# Patient Record
Sex: Female | Born: 1960 | Race: Black or African American | Hispanic: No | Marital: Single | Smoking: Former smoker
Health system: Southern US, Community
[De-identification: ages and names within clinical notes are randomized; demographics above are authoritative.]

## PROBLEM LIST (undated history)

## (undated) DIAGNOSIS — G43909 Migraine, unspecified, not intractable, without status migrainosus: Secondary | ICD-10-CM

## (undated) HISTORY — PX: ABDOMINAL HYSTERECTOMY: SHX81

## (undated) HISTORY — PX: UTERINE FIBROID SURGERY: SHX826

---

## 2014-07-08 ENCOUNTER — Emergency Department (HOSPITAL_COMMUNITY)
Admission: EM | Admit: 2014-07-08 | Discharge: 2014-07-08 | Disposition: A | Discharge status: Home / Self Care | Payer: Self-pay | Attending: Emergency Medicine | Admitting: Emergency Medicine

## 2014-07-08 ENCOUNTER — Encounter (HOSPITAL_COMMUNITY): Payer: Self-pay | Admitting: Physical Medicine and Rehabilitation

## 2014-07-08 DIAGNOSIS — Z8679 Personal history of other diseases of the circulatory system: Secondary | ICD-10-CM | POA: Insufficient documentation

## 2014-07-08 DIAGNOSIS — J040 Acute laryngitis: Secondary | ICD-10-CM | POA: Insufficient documentation

## 2014-07-08 DIAGNOSIS — H65111 Acute and subacute allergic otitis media (mucoid) (sanguinous) (serous), right ear: Secondary | ICD-10-CM | POA: Insufficient documentation

## 2014-07-08 DIAGNOSIS — Z87891 Personal history of nicotine dependence: Secondary | ICD-10-CM | POA: Insufficient documentation

## 2014-07-08 HISTORY — DX: Migraine, unspecified, not intractable, without status migrainosus: G43.909

## 2014-07-08 MED ORDER — SALINE SPRAY 0.65 % NA SOLN: 1.0000 | NASAL | Status: DC | PRN

## 2014-07-08 MED ORDER — CEFDINIR 300 MG PO CAPS
300.0000 mg | ORAL_CAPSULE | Freq: Two times a day (BID) | ORAL | Status: DC
Start: 2014-07-08 — End: 2014-07-08

## 2014-07-08 MED ORDER — IBUPROFEN 600 MG PO TABS: 600.0000 mg | ORAL_TABLET | Freq: Four times a day (QID) | ORAL | Status: DC | PRN

## 2014-07-08 MED ORDER — AZITHROMYCIN 250 MG PO TABS: 250.0000 mg | ORAL_TABLET | Freq: Every day | ORAL | Status: DC

## 2014-07-08 NOTE — ED Notes (Signed)
Pt presents to department for evaluation of sore throat, runny nose and chest congestion. Respirations unlabored. NAD.

## 2014-07-08 NOTE — Discharge Instructions (Signed)
Take 400-600mg Ibuprofen (Motrin) every 6-8 hours for fever and pain  °Alternate with Tylenol  °Take 500-1000mg Tylenol every 4-6 hours as needed for fever and pain  °Follow-up with your primary care provider next week for recheck of symptoms if not improving.  °Be sure to drink plenty of fluids and rest, at least 8hrs of sleep a night, preferably more while you are sick. °Return to the ED if you cannot keep down fluids/signs of dehydration, fever not reducing with Tylenol, difficulty breathing/wheezing, stiff neck, worsening condition, or other concerns (see below)  ° °

## 2014-07-08 NOTE — ED Provider Notes (Signed)
CSN: [id number]     Arrival date & time 07/08/14  1046 History  This chart was scribed for non-physician practitioner, Junius Finner, PA-C, working with Zadie Rhine, MD by Charline Bills, ED Scribe. This patient was seen in room TR06C/TR06C and the patient's care was started at 11:24 AM.   Chief Complaint  Patient presents with  . Sore Throat  . Nasal Congestion   The history is provided by the patient. No language interpreter was used.   HPI Comments: Emma Estrada is a 54 y.o. female who presents to the Emergency Department complaining of gradually worsening sore throat for the past 2 days. She reports associated rhinorrhea, congestion, L ear pain, hoarseness, cough, chest pain only with deep breaths. Throat pain is 6/10 at worst. Pt denies fever, HA, abdominal pain, nausea, vomiting, h/o asthma, h/o COPD. She has tried NyQuil last night. Possible allergy to Augmenting but not other known allergies. No local PCP; pt recently moved from [facility].    Past Medical History  Diagnosis Date  . Migraine    History reviewed. No pertinent past surgical history. No family history on file. History  Substance Use Topics  . Smoking status: Former Games developer  . Smokeless tobacco: Not on file  . Alcohol Use: No   OB History    No data available     Review of Systems  Constitutional: Negative for fever.  HENT: Positive for congestion, ear pain, rhinorrhea, sore throat and voice change.   Respiratory: Positive for cough.   Cardiovascular: Positive for chest pain (only with deep breaths).  Gastrointestinal: Negative for nausea, vomiting and abdominal pain.  Neurological: Negative for headaches.   Allergies  Review of patient's allergies indicates no known allergies.  Home Medications   Prior to Admission medications   Medication Sig Start Date End Date Taking? Authorizing Provider  cefdinir (OMNICEF) 300 MG capsule Take 1 capsule (300 mg total) by mouth 2 (two) times daily. 07/08/14   Junius Finner, PA-C  ibuprofen (ADVIL,MOTRIN) 600 MG tablet Take 1 tablet (600 mg total) by mouth every 6 (six) hours as needed. 07/08/14   Junius Finner, PA-C  sodium chloride (OCEAN) 0.65 % SOLN nasal spray Place 1 spray into both nostrils as needed for congestion. 07/08/14   Junius Finner, PA-C   BP 154/84 mmHg  Pulse 66  Temp(Src) 98.2 F (36.8 C) (Oral)  Resp 20  SpO2 98% Physical Exam  Constitutional: She is oriented to person, place, and time. She appears well-developed and well-nourished.  HENT:  Head: Normocephalic and atraumatic.  Right Ear: No mastoid tenderness. Tympanic membrane is erythematous and bulging.  Left Ear: No mastoid tenderness. Tympanic membrane is not erythematous. A middle ear effusion is present.  Nose: Mucosal edema and rhinorrhea present. Right sinus exhibits no maxillary sinus tenderness and no frontal sinus tenderness. Left sinus exhibits frontal sinus tenderness. Left sinus exhibits no maxillary sinus tenderness.  Mouth/Throat: Uvula is midline and mucous membranes are normal. Posterior oropharyngeal edema (mild) and posterior oropharyngeal erythema present. No oropharyngeal exudate or tonsillar abscesses.  Hoarse voice. No "hot potato voice".   Eyes: EOM are normal.  Neck: Normal range of motion. Neck supple.  Cardiovascular: Normal rate, regular rhythm and normal heart sounds.   Pulmonary/Chest: Effort normal and breath sounds normal. No stridor.  Abdominal: Soft. There is no tenderness.  Musculoskeletal: Normal range of motion.  Neurological: She is alert and oriented to person, place, and time.  Skin: Skin is warm and dry.  Psychiatric: She has a  normal mood and affect. Her behavior is normal.  Nursing note and vitals reviewed.  ED Course  Procedures (including critical care time) DIAGNOSTIC STUDIES: Oxygen Saturation is 98% on RA, normal by my interpretation.    COORDINATION OF CARE: 11:30 AM-Discussed treatment plan which includes EKG, Omnicef, nasal  spay, 600 mg ibuprofen with pt at bedside and pt agreed to plan.   Labs Review Labs Reviewed - No data to display  Imaging Review No results found.   EKG Interpretation None      MDM   Final diagnoses:  Acute mucoid otitis media of right ear  Laryngitis    Pt presenting to ED with c/o URI symptoms that started 2 days ago. Right TM is erythematous and bulging. Voice is hoarse on exam w/o stridor or hot-potato voice. No evidence of tonsillar abscess. No respiratory distress. Lungs: CTAB.  No CXR indicated at this time.  Will tx for AOM. Pt believes she had an allergic reaction to augmentin in the past, will tx with cefdinir.  Home care instructions provided. Advised to f/u with PCP at [facility] also provided resource guide to f/u if symptoms not improving. Return precautions provided. Pt verbalized understanding and agreement with tx plan.   I personally performed the services described in this documentation, which was scribed in my presence. The recorded information has been reviewed and is accurate.   Junius Finnerrin O'Malley, PA-C 07/08/14 1216  Zadie Rhineonald Wickline, MD 07/08/14 [phone]

## 2016-07-30 ENCOUNTER — Emergency Department (HOSPITAL_COMMUNITY): Payer: Self-pay

## 2016-07-30 ENCOUNTER — Encounter (HOSPITAL_COMMUNITY): Payer: Self-pay | Admitting: Emergency Medicine

## 2016-07-30 DIAGNOSIS — R0789 Other chest pain: Secondary | ICD-10-CM | POA: Insufficient documentation

## 2016-07-30 DIAGNOSIS — Z79899 Other long term (current) drug therapy: Secondary | ICD-10-CM | POA: Insufficient documentation

## 2016-07-30 DIAGNOSIS — Z87891 Personal history of nicotine dependence: Secondary | ICD-10-CM | POA: Insufficient documentation

## 2016-07-30 LAB — CBC
HEMATOCRIT: 39.2 % (ref 36.0–46.0)
Hemoglobin: 13.4 g/dL (ref 12.0–15.0)
MCH: 27.7 pg (ref 26.0–34.0)
MCHC: 34.2 g/dL (ref 30.0–36.0)
MCV: 81 fL (ref 78.0–100.0)
Platelets: 274 10*3/uL (ref 150–400)
RBC: 4.84 MIL/uL (ref 3.87–5.11)
RDW: 12.9 % (ref 11.5–15.5)
WBC: 10.2 10*3/uL (ref 4.0–10.5)

## 2016-07-30 LAB — BASIC METABOLIC PANEL
Anion gap: 10 (ref 5–15)
BUN: 12 mg/dL (ref 6–20)
CHLORIDE: 106 mmol/L (ref 101–111)
CO2: 23 mmol/L (ref 22–32)
Calcium: 9.3 mg/dL (ref 8.9–10.3)
Creatinine, Ser: 0.86 mg/dL (ref 0.44–1.00)
GFR calc non Af Amer: >60 mL/min (ref >60)
Glucose, Bld: 123 mg/dL — ABNORMAL HIGH (ref 65–99)
POTASSIUM: 3.7 mmol/L (ref 3.5–5.1)
Sodium: 139 mmol/L (ref 135–145)

## 2016-07-30 LAB — I-STAT TROPONIN, ED: TROPONIN I, POC: 0 ng/mL (ref 0.00–0.08)

## 2016-07-30 NOTE — ED Triage Notes (Signed)
Patient with chest pain that started earlier today around 3pm.   Patient did have some mild shortness of breath and mild nausea at the start of the pain.  Pain is located in right chest and radiating to right wrist.

## 2016-07-31 ENCOUNTER — Emergency Department (HOSPITAL_COMMUNITY)
Admission: EM | Admit: 2016-07-31 | Discharge: 2016-07-31 | Disposition: A | Discharge status: Home / Self Care | Payer: Self-pay | Attending: Emergency Medicine | Admitting: Emergency Medicine

## 2016-07-31 DIAGNOSIS — R079 Chest pain, unspecified: Secondary | ICD-10-CM

## 2016-07-31 DIAGNOSIS — R0789 Other chest pain: Secondary | ICD-10-CM

## 2016-07-31 LAB — I-STAT TROPONIN, ED: TROPONIN I, POC: 0.01 ng/mL (ref 0.00–0.08)

## 2016-07-31 MED ORDER — KETOROLAC TROMETHAMINE 30 MG/ML IJ SOLN
30.0000 mg | Freq: Once | INTRAMUSCULAR | Status: AC
  Administered 2016-07-31: 30 mg via INTRAVENOUS
  Filled 2016-07-31: qty 1

## 2016-07-31 NOTE — ED Notes (Signed)
Pt verbalized understanding of d/c instructions and has no further questions. Pt is stable, A&Ox4, VSS.  

## 2016-07-31 NOTE — Discharge Instructions (Signed)
Please read and follow all provided instructions.  Your diagnoses today include:  1. Chest pain, unspecified type   2. Chest wall pain     Tests performed today include: An EKG of your heart A chest x-ray Cardiac enzymes - a blood test for heart muscle damage Blood counts and electrolytes Vital signs. See below for your results today.   Medications prescribed:   Take any prescribed medications only as directed.  Follow-up instructions: Please follow-up with your primary care provider as soon as you can for further evaluation of your symptoms.   Return instructions:  SEEK IMMEDIATE MEDICAL ATTENTION IF: You have severe chest pain, especially if the pain is crushing or pressure-like and spreads to the arms, back, neck, or jaw, or if you have sweating, nausea (feeling sick to your stomach), or shortness of breath. THIS IS AN EMERGENCY. Don't wait to see if the pain will go away. Get medical help at once. Call 911 or 0 (operator). DO NOT drive yourself to the hospital.  Your chest pain gets worse and does not go away with rest.  You have an attack of chest pain lasting longer than usual, despite rest and treatment with the medications your caregiver has prescribed.  You wake from sleep with chest pain or shortness of breath. You feel dizzy or faint. You have chest pain not typical of your usual pain for which you originally saw your caregiver.  You have any other emergent concerns regarding your health.  Additional Information: Chest pain comes from many different causes. Your caregiver has diagnosed you as having chest pain that is not specific for one problem, but does not require admission.  You are at low risk for an acute heart condition or other serious illness.   Your vital signs today were: BP (!) 155/86    Pulse 77    Temp 98.4 F (36.9 C) (Oral)    Resp 19    SpO2 99%  If your blood pressure (BP) was elevated above 135/85 this visit, please have this repeated by your doctor  within one month. --------------

## 2016-07-31 NOTE — ED Provider Notes (Signed)
[facility]-EMERGENCY DEPT Provider Note   CSN: [id number] Arrival date & time: 07/30/16  2146     History   Chief Complaint Chief Complaint  Patient presents with  . Chest Pain    HPI Emma Estrada is a 56 y.o. female.  HPI  56 y.o. female, presents to the Emergency Department today complaining of chest pain that started around 1500. This occurred while at her desk while she was typing on the computer. Notes mild shortness of breath as well as nausea with pain. None now. Pain was located on right chest wall. Noted hx same with costochondritis. No fevers. No cough/congestion. No URI symptoms. States that the pain has been constant and unchanged since this morning. Worse with movement. No meds PTA or non tried for pain. No hx ACS. No FH. Noted Risk Factor is HTN. No hx DVT/PE. No recent travel. No recent surgeries. Pt also notes right wrist pain x 1 week. Worse with movement. No trauma to area. Worse with typing. No numbness. No other symptoms noted.     Past Medical History:  Diagnosis Date  . Migraine     There are no active problems to display for this patient.   History reviewed. No pertinent surgical history.  OB History    No data available       Home Medications    Prior to Admission medications   Medication Sig Start Date End Date Taking? Authorizing Provider  azithromycin (ZITHROMAX) 250 MG tablet Take 1 tablet (250 mg total) by mouth daily. Take first 2 tablets together, then 1 every day until finished. 07/08/14   Junius Finner, PA-C  ibuprofen (ADVIL,MOTRIN) 600 MG tablet Take 1 tablet (600 mg total) by mouth every 6 (six) hours as needed. 07/08/14   Junius Finner, PA-C  sodium chloride (OCEAN) 0.65 % SOLN nasal spray Place 1 spray into both nostrils as needed for congestion. 07/08/14   Junius Finner, PA-C    Family History No family history on file.  Social History Social History  Substance Use Topics  . Smoking status: Former Games developer  . Smokeless tobacco: Never  Used  . Alcohol use No     Allergies   Patient has no known allergies.   Review of Systems Review of Systems ROS reviewed and all are negative for acute change except as noted in the HPI.  Physical Exam Updated Vital Signs BP 134/78 (BP Location: Right Arm)   Pulse 63   Temp 98.4 F (36.9 C) (Oral)   Resp 16   SpO2 98%   Physical Exam  Constitutional: She is oriented to person, place, and time. She appears well-developed and well-nourished. No distress.  HENT:  Head: Normocephalic and atraumatic.  Right Ear: Tympanic membrane, external ear and ear canal normal.  Left Ear: Tympanic membrane, external ear and ear canal normal.  Nose: Nose normal.  Mouth/Throat: Uvula is midline, oropharynx is clear and moist and mucous membranes are normal. No trismus in the jaw. No oropharyngeal exudate, posterior oropharyngeal erythema or tonsillar abscesses.  Eyes: EOM are normal. Pupils are equal, round, and reactive to light.  Neck: Normal range of motion. Neck supple. No tracheal deviation present.  Cardiovascular: Normal rate, regular rhythm, S1 normal, S2 normal, normal heart sounds, intact distal pulses and normal pulses.   Pulmonary/Chest: Effort normal and breath sounds normal. No respiratory distress. She has no decreased breath sounds. She has no wheezes. She has no rhonchi. She has no rales.  TTP right anterior chest wall. No palpable or  visible deformities.   Abdominal: Normal appearance and bowel sounds are normal. There is no tenderness.  Musculoskeletal: Normal range of motion.  Right Wrist TTP along lateral aspect. Decrease ROM due to pain. No swelling. No erythema. NVI. Distal pulses appreciated.   Neurological: She is alert and oriented to person, place, and time.  Skin: Skin is warm and dry.  Psychiatric: She has a normal mood and affect. Her speech is normal and behavior is normal. Thought content normal.     ED Treatments / Results  Labs (all labs ordered are  listed, but only abnormal results are displayed) Labs Reviewed  BASIC METABOLIC PANEL - Abnormal; Notable for the following:       Result Value   Glucose, Bld 123 (*)    All other components within normal limits  CBC  I-STAT TROPOININ, ED  I-STAT TROPOININ, ED    EKG  EKG Interpretation  Date/Time:  Monday July 30 2016 21:52:52 EDT Ventricular Rate:  69 PR Interval:  126 QRS Duration: 94 QT Interval:  384 QTC Calculation: 411 R Axis:   71 Text Interpretation:  Normal sinus rhythm Normal ECG No significant change was found Confirmed by Manus Gunning  MD, STEPHEN [phone]) on 07/31/2016 3:19:42 AM       Radiology Dg Chest 2 View  Result Date: 07/30/2016 CLINICAL DATA:  56 y/o  F; right-sided chest pain for 1 day. EXAM: CHEST  2 VIEW COMPARISON:  None. FINDINGS: The heart size and mediastinal contours are within normal limits. Both lungs are clear. Incidental note of azygos fissure. The visualized skeletal structures are unremarkable. IMPRESSION: No active cardiopulmonary disease. Electronically Signed   By: Mitzi Hansen M.D.   On: 07/30/2016 22:58   Dg Wrist Complete Right  Result Date: 07/30/2016 CLINICAL DATA:  56 y/o  F; 1 week of right wrist pain. EXAM: RIGHT WRIST - COMPLETE 3+ VIEW COMPARISON:  None. FINDINGS: No acute fracture or dislocation identified. Chronic ulnar styloid fracture deformity. No focal arthropathy. No soft tissue abnormality is evident. IMPRESSION: No acute bony articular abnormality identified. Chronic ulnar styloid fracture. Electronically Signed   By: Mitzi Hansen M.D.   On: 07/30/2016 23:00    Procedures Procedures (including critical care time)  Medications Ordered in ED Medications - No data to display   Initial Impression / Assessment and Plan / ED Course  I have reviewed the triage vital signs and the nursing notes.  Pertinent labs & imaging results that were available during my care of the patient were reviewed by me and  considered in my medical decision making (see chart for details).  Final Clinical Impressions(s) / ED Diagnoses  {I have reviewed and evaluated the relevant laboratory values. {I have reviewed and evaluated the relevant imaging studies. {I have interpreted the relevant EKG. {I have reviewed the relevant previous healthcare records.  {I obtained HPI from historian. {Patient discussed with supervising physician.  ED Course:  Assessment: Pt is a 56 y.o. female presents with CP since this morning. Constant and unchanged. Nausea and SOB initially, but none now. Chest pain reproducible on exam. Risk Factors for ACS HTN. Given toradol in ED with minimal relief.. Patient is to be discharged with recommendation to follow up with PCP in regards to today's hospital visit. Chest pain is not likely of cardiac or pulmonary etiology d/t presentation, VSS, no tracheal deviation, no JVD or new murmur, RRR, breath sounds equal bilaterally, EKG without acute abnormalities, negative troponin x2, and negative CXR. Heart Score 3. Likely musculoskeletal  in etiology. Pt extremely TTP on anterior aspect of chest. Pt has been advised start a NSAIDsand return to the ED is CP becomes exertional, associated with diaphoresis or nausea, radiates to left jaw/arm, worsens or becomes concerning in any way. Pt appears reliable for follow up and is agreeable to discharge. Patient is in no acute distress. Vital Signs are stable. Patient is able to ambulate. Patient able to tolerate PO.   Disposition/Plan:  DC Home Additional Verbal discharge instructions given and discussed with patient.  Pt Instructed to f/u with PCP in the next week for evaluation and treatment of symptoms. Return precautions given Pt acknowledges and agrees with plan  Supervising Physician Glynn Octave, MD  Final diagnoses:  Chest pain, unspecified type  Chest wall pain    New Prescriptions New Prescriptions   No medications on file     Audry Pili,  PA-C 07/31/16 0503    Glynn Octave, MD 07/31/16 [phone]

## 2017-03-21 ENCOUNTER — Emergency Department (HOSPITAL_COMMUNITY)
Admission: EM | Admit: 2017-03-21 | Discharge: 2017-03-21 | Disposition: A | Discharge status: Home / Self Care | Payer: Self-pay | Attending: Emergency Medicine | Admitting: Emergency Medicine

## 2017-03-21 ENCOUNTER — Encounter (HOSPITAL_COMMUNITY): Payer: Self-pay

## 2017-03-21 ENCOUNTER — Other Ambulatory Visit: Payer: Self-pay

## 2017-03-21 DIAGNOSIS — K1120 Sialoadenitis, unspecified: Secondary | ICD-10-CM | POA: Insufficient documentation

## 2017-03-21 DIAGNOSIS — Z87891 Personal history of nicotine dependence: Secondary | ICD-10-CM | POA: Insufficient documentation

## 2017-03-21 MED ORDER — CLINDAMYCIN HCL 150 MG PO CAPS: 450.0000 mg | ORAL_CAPSULE | Freq: Three times a day (TID) | ORAL | 0 refills | Status: DC

## 2017-03-21 NOTE — Discharge Instructions (Signed)
1. Medications: Clindamycin, usual home medications 2. Treatment: rest, drink plenty of fluids, eat sour foods and candy 3. Follow Up: Please followup with ENT if no improvement in 3-4 days for discussion of your diagnoses and further evaluation after today's visit; Please return to the ER for fevers, increased swelling, difficulty swallowing or other concerns

## 2017-03-21 NOTE — ED Triage Notes (Signed)
Patient presents with a "knot under my tongue." Patient reports it first started when she was eating lunch 2 weeks ago. Since then, the patient reports having the "knot" "pouch out" every time she eats. Patient reports increased facial swelling and a "nasty taste in my mouth." Patient unsure if she has had fevers. Patient reports being given a prescription of PCN for the issue, but states she has taken 4 days of the prescription and "nothing is getting better."

## 2017-03-21 NOTE — ED Provider Notes (Signed)
[facility]-EMERGENCY DEPT Provider Note   CSN: [id number] Arrival date & time: 03/21/17  2014     History   Chief Complaint Chief Complaint  Patient presents with  . Lump under tongue    HPI Emma Estrada is a 56 y.o. female with a hx of migraine headache presents to the Emergency Department complaining of gradual, persistent, progressively worsening swelling under her tongue and right jaw onset 2 weeks ago.  Pt reports pain and swelling are worse after eating but improve after a few hours. She reports recently developing a "sour taste" in her mouth.  She reports no fevers at home.  She completed a course of PCN last week given by her dentist who thought this might be a dental infection, but "x-ray was negative."  She denies pain in her teeth, drooling, difficulty swallowing, difficulty breathing.  Pt denies known hx of HPV.  She is a former smoker.  No personal hx of cancer.  Pt reports the site is painful when she eats, but she has been able to do so.      The history is provided by the patient and medical records. No language interpreter was used.    Past Medical History:  Diagnosis Date  . Migraine     There are no active problems to display for this patient.   History reviewed. No pertinent surgical history.  OB History    No data available       Home Medications    Prior to Admission medications   Medication Sig Start Date End Date Taking? Authorizing Provider  clindamycin (CLEOCIN) 150 MG capsule Take 3 capsules (450 mg total) by mouth 3 (three) times daily. 03/21/17   Tracyann Duffell, Dahlia ClientHannah, PA-C    Family History History reviewed. No pertinent family history.  Social History Social History   Tobacco Use  . Smoking status: Former Games developermoker  . Smokeless tobacco: Never Used  Substance Use Topics  . Alcohol use: No  . Drug use: No     Allergies   Codeine and Ibuprofen   Review of Systems Review of Systems  Constitutional:  Negative for appetite change, diaphoresis, fatigue, fever and unexpected weight change.  HENT: Positive for facial swelling. Negative for mouth sores.        Lump under tongue  Eyes: Negative for visual disturbance.  Respiratory: Negative for cough, chest tightness, shortness of breath and wheezing.   Cardiovascular: Negative for chest pain.  Gastrointestinal: Negative for abdominal pain, constipation, diarrhea, nausea and vomiting.  Endocrine: Negative for polydipsia, polyphagia and polyuria.  Genitourinary: Negative for dysuria, frequency, hematuria and urgency.  Musculoskeletal: Negative for back pain and neck stiffness.  Skin: Negative for rash.  Allergic/Immunologic: Negative for immunocompromised state.  Neurological: Negative for syncope, light-headedness and headaches.  Hematological: Does not bruise/bleed easily.  Psychiatric/Behavioral: Negative for sleep disturbance. The patient is not nervous/anxious.      Physical Exam Updated Vital Signs BP (!) 157/91 (BP Location: Left Arm)   Pulse 71   Temp 98.5 F (36.9 C)   Resp 18   Ht 5\' 5"  (1.651 m)   Wt 90.7 kg (200 lb)   SpO2 97%   BMI 33.28 kg/m   Physical Exam  Constitutional: She appears well-developed and well-nourished. No distress.  HENT:  Head: Normocephalic and atraumatic.  Right Ear: No decreased hearing is noted.  Nose: Nose normal.  Mouth/Throat: Uvula is midline and mucous membranes are normal. Mucous membranes are not dry. Oral lesions present. No trismus  in the jaw. No uvula swelling. No oropharyngeal exudate, posterior oropharyngeal edema, posterior oropharyngeal erythema or tonsillar abscesses.  Eyes: Conjunctivae are normal.  Neck: Normal range of motion, full passive range of motion without pain and phonation normal. No tracheal tenderness present. No neck rigidity. No tracheal deviation, no erythema and normal range of motion present. No Brudzinski's sign and no Kernig's sign noted. No thyromegaly  present.  Range of motion without pain  Normal phonation No stridor Handling secretions without difficulty No nuchal rigidity or meningeal signs  Cardiovascular: Normal rate and regular rhythm.  Pulmonary/Chest: Effort normal. No respiratory distress.  Equal chest expansion, clear and equal breath sounds without focal wheezes, rhonchi or rales  Musculoskeletal: Normal range of motion.  Lymphadenopathy:       Head (right side): Submandibular adenopathy present. No submental, no tonsillar, no preauricular, no posterior auricular and no occipital adenopathy present.       Head (left side): No submental, no submandibular, no tonsillar, no preauricular, no posterior auricular and no occipital adenopathy present.    She has no cervical adenopathy.       Right cervical: No superficial cervical, no deep cervical and no posterior cervical adenopathy present.      Left cervical: No superficial cervical, no deep cervical and no posterior cervical adenopathy present.  Neurological: She is alert.  Alert and oriented Moves all extremities without ataxia  Skin: Skin is warm and dry. She is not diaphoretic.  Psychiatric: She has a normal mood and affect.  Nursing note and vitals reviewed.    ED Treatments / Results   Procedures Procedures (including critical care time)  Medications Ordered in ED Medications - No data to display   Initial Impression / Assessment and Plan / ED Course  I have reviewed the triage vital signs and the nursing notes.  Pertinent labs & imaging results that were available during my care of the patient were reviewed by me and considered in my medical decision making (see chart for details).     Presents with swelling under the right side of her jaw and under her tongue.  Patient is without fever.  She does have some swelling and erythema at [facility] duct and has a palpable submandibular salivary gland.  No erythema of the skin.  No swelling of the parotid region.   Patient with some submandibular lymphadenopathy as well.  No evidence of Ludwig's angina.  Teeth are not abnormal.  No swelling of the gingiva.  No woody induration under the tongue.  Discussed clinical presentation of sialadenitis.  Less likely to be tumor.  No signs or symptoms of deep space infection.  Patient offered CT scan of her soft tissues of her neck and declines at this time.  I believe this is reasonable.  Patient given clindamycin, instructed to use sour candy and follow-up with ear nose and throat.  Also discussed discussed reasons to return immediately to the emergency department including signs and symptoms of Ludwig's angina.  Patient states understanding and is in agreement with this plan.  Final Clinical Impressions(s) / ED Diagnoses   Final diagnoses:  Sialadenitis    ED Discharge Orders        Ordered    clindamycin (CLEOCIN) 150 MG capsule  3 times daily     03/21/17 2337       Anmol Paschen, Dahlia ClientHannah, PA-C 03/22/17 0003    Melene PlanFloyd, Dan, DO 03/22/17 1610

## 2017-10-02 ENCOUNTER — Emergency Department (HOSPITAL_COMMUNITY): Payer: Self-pay

## 2017-10-02 ENCOUNTER — Other Ambulatory Visit: Payer: Self-pay

## 2017-10-02 ENCOUNTER — Emergency Department (HOSPITAL_COMMUNITY)
Admission: EM | Admit: 2017-10-02 | Discharge: 2017-10-02 | Disposition: A | Discharge status: Home / Self Care | Payer: Self-pay | Attending: Emergency Medicine | Admitting: Emergency Medicine

## 2017-10-02 ENCOUNTER — Encounter (HOSPITAL_COMMUNITY): Payer: Self-pay | Admitting: Emergency Medicine

## 2017-10-02 DIAGNOSIS — R0602 Shortness of breath: Secondary | ICD-10-CM | POA: Insufficient documentation

## 2017-10-02 DIAGNOSIS — I10 Essential (primary) hypertension: Secondary | ICD-10-CM | POA: Insufficient documentation

## 2017-10-02 DIAGNOSIS — R0789 Other chest pain: Secondary | ICD-10-CM | POA: Insufficient documentation

## 2017-10-02 DIAGNOSIS — Z87891 Personal history of nicotine dependence: Secondary | ICD-10-CM | POA: Insufficient documentation

## 2017-10-02 DIAGNOSIS — Z79899 Other long term (current) drug therapy: Secondary | ICD-10-CM | POA: Insufficient documentation

## 2017-10-02 LAB — I-STAT BETA HCG BLOOD, ED (MC, WL, AP ONLY)

## 2017-10-02 LAB — BRAIN NATRIURETIC PEPTIDE: B NATRIURETIC PEPTIDE 5: 33.7 pg/mL (ref 0.0–100.0)

## 2017-10-02 LAB — BASIC METABOLIC PANEL
ANION GAP: 10 (ref 5–15)
BUN: 8 mg/dL (ref 6–20)
CO2: 24 mmol/L (ref 22–32)
Calcium: 9.2 mg/dL (ref 8.9–10.3)
Chloride: 110 mmol/L (ref 98–111)
Creatinine, Ser: 0.82 mg/dL (ref 0.44–1.00)
GFR calc Af Amer: >60 mL/min (ref >60)
GFR calc non Af Amer: >60 mL/min (ref >60)
GLUCOSE: 98 mg/dL (ref 70–99)
Potassium: 4.1 mmol/L (ref 3.5–5.1)
Sodium: 144 mmol/L (ref 135–145)

## 2017-10-02 LAB — I-STAT TROPONIN, ED
TROPONIN I, POC: 0 ng/mL (ref 0.00–0.08)
Troponin i, poc: 0 ng/mL (ref 0.00–0.08)

## 2017-10-02 LAB — CBC
HEMATOCRIT: 41 % (ref 36.0–46.0)
HEMOGLOBIN: 13.6 g/dL (ref 12.0–15.0)
MCH: 26.9 pg (ref 26.0–34.0)
MCHC: 33.2 g/dL (ref 30.0–36.0)
MCV: 81 fL (ref 78.0–100.0)
Platelets: 277 10*3/uL (ref 150–400)
RBC: 5.06 MIL/uL (ref 3.87–5.11)
RDW: 12.9 % (ref 11.5–15.5)
WBC: 8.1 10*3/uL (ref 4.0–10.5)

## 2017-10-02 LAB — D-DIMER, QUANTITATIVE (NOT AT ARMC): D DIMER QUANT: 0.39 ug{FEU}/mL (ref 0.00–0.50)

## 2017-10-02 MED ORDER — FUROSEMIDE 10 MG/ML IJ SOLN
20.0000 mg | Freq: Once | INTRAMUSCULAR | Status: AC
  Administered 2017-10-02: 20 mg via INTRAVENOUS
  Filled 2017-10-02: qty 2

## 2017-10-02 MED ORDER — AMLODIPINE BESYLATE 5 MG PO TABS: 5.0000 mg | ORAL_TABLET | Freq: Every day | ORAL | 0 refills | Status: DC

## 2017-10-02 NOTE — ED Provider Notes (Signed)
[name] EMERGENCY DEPARTMENT Provider Note   CSN: [id number] Arrival date & time: 10/02/17  1209     History   Chief Complaint Chief Complaint  Patient presents with  . Chest Pain  . Shortness of Breath    HPI Emma Estrada is a 57 y.o. female presenting for evaluation of shortness of breath and high blood pressure.  Pt states on Monday she had acute onset of sharp L sided CP, radiating to her L breast,. This resolved without intervention. On Monday she also notes DOE, felt like she was breathing very heavily after climbing 1 flight of stairs.  She lives on a second floor apartment, states this is abnormal for her.  Since then, she has had continued shortness of breath, although not as pronounced.  Today at work, they checked her blood pressure and it was elevated in the 180 systolic.  She denies a history of hypertension, is not on hypertensive medications.  She denies any medical problems, does not take medications daily.  She denies history of ACS, CAD, HTN, HLD.  She does not smoke cigarettes, denies alcohol or drug use.  She denies family history of heart attacks.  She reports bilateral upper extremities are slightly more swollen over the past 2 days.  She denies fevers, chills, cough, nausea, vomiting, abdominal pain, urinary symptoms, normal bowel movements.  She is currently symptom-free, chest pain has not returned.  She does report posterior left shoulder pain, worse with movement of her left arm.    HPI  Past Medical History:  Diagnosis Date  . Migraine     There are no active problems to display for this patient.   Past Surgical History:  Procedure Laterality Date  . ABDOMINAL HYSTERECTOMY    . CESAREAN SECTION    . UTERINE FIBROID SURGERY       OB History   None      Home Medications    Prior to Admission medications   Medication Sig Start Date End Date Taking? Authorizing Provider  Aspirin-Acetaminophen-Caffeine (GOODY HEADACHE PO)  Take 1 Package by mouth as needed (headache).   Yes [provider]  ibuprofen (ADVIL,MOTRIN) 200 MG tablet Take 800 mg by mouth as needed for headache or moderate pain.   Yes [provider]  Sennosides (LAXATIVE PILLS PO) Take 1 tablet by mouth as needed (constipation).   Yes [provider]  amLODipine (NORVASC) 5 MG tablet Take 1 tablet (5 mg total) by mouth daily for 14 days. 10/02/17 10/16/17  Porfiria Heinrich, PA-C    Family History No family history on file.  Social History Social History   Tobacco Use  . Smoking status: Former Games developer  . Smokeless tobacco: Never Used  Substance Use Topics  . Alcohol use: No  . Drug use: No     Allergies   Codeine and Ibuprofen   Review of Systems Review of Systems  Respiratory: Positive for shortness of breath.   Cardiovascular: Positive for chest pain (resolved).  Musculoskeletal: Positive for arthralgias (L shoulder pain).  All other systems reviewed and are negative.    Physical Exam Updated Vital Signs BP (!) 156/92 (BP Location: Left Arm)   Pulse 65   Temp 98.6 F (37 C)   Resp (!) 22   Ht 5\' 5"  (1.651 m)   Wt 95.3 kg (210 lb)   SpO2 98%   BMI 34.95 kg/m   Physical Exam  Constitutional: She is oriented to person, place, and time. She appears well-developed  and well-nourished. No distress.  Appears in no distress  HENT:  Head: Normocephalic and atraumatic.  MM moist.  Eyes: Pupils are equal, round, and reactive to light. Conjunctivae and EOM are normal.  Neck: Normal range of motion. Neck supple.  Cardiovascular: Normal rate, regular rhythm and intact distal pulses.  Pulmonary/Chest: Effort normal and breath sounds normal. No respiratory distress. She has no wheezes.  Speaking in full sentences.  No signs of respiratory distress.  Abdominal: Soft. She exhibits no distension and no mass. There is no tenderness. There is no rebound and no guarding.  Musculoskeletal: Normal range of motion.  She exhibits edema. She exhibits no tenderness.  Mild nonpitting edema noted of lower legs bilaterally  Neurological: She is alert and oriented to person, place, and time. No sensory deficit.  Skin: Skin is warm and dry. Capillary refill takes less than 2 seconds.  Psychiatric: She has a normal mood and affect.  Nursing note and vitals reviewed.    ED Treatments / Results  Labs (all labs ordered are listed, but only abnormal results are displayed) Labs Reviewed  BASIC METABOLIC PANEL  CBC  BRAIN NATRIURETIC PEPTIDE  D-DIMER, QUANTITATIVE (NOT AT [facility])  I-STAT TROPONIN, ED  I-STAT BETA HCG BLOOD, ED ([facility], [facility], [facility] ONLY)  I-STAT TROPONIN, ED    EKG None  Radiology Dg Chest 2 View  Result Date: 10/02/2017 CLINICAL DATA:  Shortness of breath for several days EXAM: CHEST - 2 VIEW COMPARISON:  07/30/2016 FINDINGS: The heart size and mediastinal contours are within normal limits. Both lungs are clear. The visualized skeletal structures are unremarkable. IMPRESSION: No active cardiopulmonary disease. Electronically Signed   By: Alcide CleverMark  Lukens M.D.   On: 10/02/2017 13:40    Procedures Procedures (including critical care time)  Medications Ordered in ED Medications  furosemide (LASIX) injection 20 mg (20 mg Intravenous Given 10/02/17 1415)     Initial Impression / Assessment and Plan / ED Course  I have reviewed the triage vital signs and the nursing notes.  Pertinent labs & imaging results that were available during my care of the patient were reviewed by me and considered in my medical decision making (see chart for details).     Patient presenting for evaluation of shortness of breath, hypertension, and chest pain.  History reassuring, chest pain has completely resolved for the past day and a half.  She reports continued dyspnea on exertion, and elevated blood pressure today at work.  No history of previous hypertension.  Sats and heart rate reassuring.  No signs of respiratory  distress.  Will obtain labs, trop, EKG, chest x-ray, BNP, and d-dimer. Lasix given for BP and fluid retention.  Labs reassuring, no leukocytosis.  Creatinine stable.  Troponin negative.  D-dimer negative.  BNP reassuring. cxr viewed and interpreted by me, no pna, pnx, or effusions. No cardiomegaly. Will get delta trop and reassess.  Repeat troponin negative.  Blood pressure has improved, no change or worsening in symptoms.  At this time, doubt ACS, PE, infection, CHF. Discussed with attending, Dr. Clarene DukeLittle agrees to plan. Will d/c with amlodipine, and pt to f/u with PCP next week.  Strict return precautions given.  At this time, patient appears safe for discharge. Patient states she understands and agrees to plan.  Final Clinical Impressions(s) / ED Diagnoses   Final diagnoses:  Atypical chest pain  Shortness of breath  Hypertension, unspecified type    ED Discharge Orders        Ordered  amLODipine (NORVASC) 5 MG tablet  Daily     10/02/17 1553       Alveria Apley, PA-C 10/02/17 1715    Little, Ambrose Finland, MD 10/06/17 2025

## 2017-10-02 NOTE — Discharge Instructions (Signed)
Take amlodipine once a day to help with your blood pressure. Make sure you are staying well-hydrated.  Avoid salt.  There is information about foods and their effect on blood pressure in the paperwork. Avoid anti-inflammatories such as Advil, ibuprofen, and Aleve, as this may raise your blood pressure. It is important that you follow-up with your primary care doctor for further evaluation and management. Return to the emergency room if you develop severe headache, vision changes, numbness/weakness, or any new or concerning symptoms.

## 2017-10-02 NOTE — ED Triage Notes (Signed)
Pt presents with shortness of breath starting today. She reports having chest pain on the left side with rad to her breast, left shoulder and top part of her back. She also had elevated bp at work 186/113, they sent her here for evaluation. Pt is alert oriented speaking full sentences.

## 2017-10-02 NOTE — ED Notes (Signed)
Patient up to bathroom with no problems 

## 2017-11-25 ENCOUNTER — Encounter (HOSPITAL_COMMUNITY): Payer: Self-pay

## 2017-11-25 ENCOUNTER — Other Ambulatory Visit: Payer: Self-pay

## 2017-11-25 ENCOUNTER — Emergency Department (HOSPITAL_COMMUNITY)
Admission: EM | Admit: 2017-11-25 | Discharge: 2017-11-25 | Disposition: A | Discharge status: Home / Self Care | Payer: Self-pay | Attending: Emergency Medicine | Admitting: Emergency Medicine

## 2017-11-25 DIAGNOSIS — G43009 Migraine without aura, not intractable, without status migrainosus: Secondary | ICD-10-CM | POA: Insufficient documentation

## 2017-11-25 DIAGNOSIS — I10 Essential (primary) hypertension: Secondary | ICD-10-CM | POA: Insufficient documentation

## 2017-11-25 DIAGNOSIS — Z87891 Personal history of nicotine dependence: Secondary | ICD-10-CM | POA: Insufficient documentation

## 2017-11-25 MED ORDER — AMLODIPINE BESYLATE 5 MG PO TABS: 5.0000 mg | ORAL_TABLET | Freq: Every day | ORAL | 0 refills | Status: AC

## 2017-11-25 MED ORDER — SODIUM CHLORIDE 0.9 % IV BOLUS
1000.0000 mL | Freq: Once | INTRAVENOUS | Status: AC
  Administered 2017-11-25: 1000 mL via INTRAVENOUS

## 2017-11-25 MED ORDER — ACETAMINOPHEN 325 MG PO TABS
325.0000 mg | ORAL_TABLET | Freq: Once | ORAL | Status: AC
  Administered 2017-11-25: 325 mg via ORAL
  Filled 2017-11-25: qty 1

## 2017-11-25 MED ORDER — METOCLOPRAMIDE HCL 5 MG/ML IJ SOLN
10.0000 mg | Freq: Once | INTRAMUSCULAR | Status: AC
  Administered 2017-11-25: 10 mg via INTRAVENOUS
  Filled 2017-11-25: qty 2

## 2017-11-25 MED ORDER — DIPHENHYDRAMINE HCL 50 MG/ML IJ SOLN
12.5000 mg | Freq: Once | INTRAMUSCULAR | Status: AC
  Administered 2017-11-25: 12.5 mg via INTRAVENOUS
  Filled 2017-11-25: qty 1

## 2017-11-25 NOTE — ED Triage Notes (Signed)
Pt states she has been having high blood pressure and headaches for a while. She was told to see her PCP to get meds for htn but she never went. Pt reports some associated with blurred vision and dizziness.

## 2017-11-25 NOTE — Discharge Instructions (Signed)
I am glad we were able to help with your headache today.  I have given you a 14 day supply of Amlodipine (Norvasc) for your high blood pressure. Please, please, please find a PCP to follow-up with for this. This is the appropriate place to receive refills for this medication. If your BP is > 180/100 and you have accompanying symptoms of: headache (different from your typical migraines and today's headache), chest pain, shortness of breath, slurred speech, numbness, weakness or vision changes, then that is a reason to come back to the emergency room.  I have listed 2 primary offices that you can call for follow-up. There is also a number at the end of the discharge paperwork that you can call to find another office.

## 2017-11-25 NOTE — ED Provider Notes (Signed)
Patient placed in Quick Look pathway, seen and evaluated   Chief Complaint: HA   HPI:   57 y.o. F who presents for evaluation of headache that began today. She states that headache was gradual in nature and was not a thunderclap headache. No preceding trauma, injury or fall. She states that headache worsened today. She checked her BP at work and it was elevated with SBP in the 170s. She is supposed to be on BP medication but is not taking it. She has a history of migraines but states this feels less severe than her normal migraines. No blurry vision, numbness/weakness of her arms or legs, fevers.   ROS: HA   Physical Exam:   Gen: No distress  Neuro: Awake and Alert  Skin: Warm    Focused Exam: CN II-XII intact, 5/5 strenghth BUE and BLE   Initiation of care has begun. The patient has been counseled on the process, plan, and necessity for staying for the completion/evaluation, and the remainder of the medical screening examination    Maxwell CaulLayden, Sohan Potvin A, PA-C 11/25/17 1421    Tegeler, Canary Brimhristopher J, MD 11/25/17 [phone]

## 2017-11-25 NOTE — ED Provider Notes (Signed)
[facility] EMERGENCY DEPARTMENT Provider Note  CSN: [id number] Arrival date & time: 11/25/17  1345  History   Chief Complaint Chief Complaint  Patient presents with  . Headache    HPI Emma Estrada is a 57 y.o. female with a medical history of migraines and HTN who presented to the ED for headache x1 day. She currently describes throbbing frontal and occipital pain that began this morning. She states that she felt dizzy earlier, but that has resolved. Patient reports intermittent headaches last week that are similar in nature. Patient states that current headache is not as severe as her typical migraine. Denies recent head trauma, injury or fall. Denies vision changes, paresthesias, N/V and photophobia. Patient has not taken any medication for her headache today.  Patient expresses additional concern because she states that when headache began at work that she checked her BP and it was 170/101. Patient reports being diagnosed with HTN at last ED visit in 09/2017 and given 2 week supply of Norvasc. She states that she has not followed up with PCP.     Past Medical History:  Diagnosis Date  . Migraine     There are no active problems to display for this patient.   Past Surgical History:  Procedure Laterality Date  . ABDOMINAL HYSTERECTOMY    . CESAREAN SECTION    . UTERINE FIBROID SURGERY       OB History   None      Home Medications    Prior to Admission medications   Medication Sig Start Date End Date Taking? Authorizing Provider  acetaminophen (TYLENOL) 500 MG tablet Take 1,000 mg by mouth every 6 (six) hours as needed for headache.   Yes [provider]  Aspirin-Acetaminophen-Caffeine (GOODY HEADACHE PO) Take 1 packet by mouth 2 (two) times daily as needed (headache).    Yes [provider]  OVER THE COUNTER MEDICATION Take 1 tablet by mouth daily as needed (constipation). Over the counter women's laxative   Yes [provider]  amLODipine (NORVASC) 5 MG tablet Take 1 tablet (5 mg total) by mouth daily for 14 days. 11/25/17 12/09/17  Mortis, Sharyon Medicus, PA-C    Family History History reviewed. No pertinent family history.  Social History Social History   Tobacco Use  . Smoking status: Former Games developer  . Smokeless tobacco: Never Used  Substance Use Topics  . Alcohol use: No  . Drug use: No     Allergies   Codeine and Ibuprofen   Review of Systems Review of Systems  Constitutional: Negative.   HENT: Negative.   Eyes: Negative for photophobia, pain and visual disturbance.  Respiratory: Negative.   Cardiovascular: Negative.   Gastrointestinal: Negative.   Musculoskeletal: Negative for neck pain and neck stiffness.  Skin: Negative.   Neurological: Positive for dizziness and headaches. Negative for facial asymmetry, speech difficulty, weakness and numbness.  Psychiatric/Behavioral: Negative.    Physical Exam Updated Vital Signs BP (!) 153/90 (BP Location: Right Arm)   Pulse (!) 58   Temp 98.6 F (37 C) (Oral)   Resp 18   SpO2 98%   Physical Exam  Constitutional: She is oriented to person, place, and time. She appears well-developed and well-nourished.  HENT:  Head: Normocephalic and atraumatic.  Eyes: Pupils are equal, round, and reactive to light. Conjunctivae, EOM and lids are normal.  Neck: Normal range of motion and full passive range of motion without pain. Neck supple. No spinous process tenderness and no muscular tenderness  present. Normal range of motion present.  Cardiovascular: Normal rate, regular rhythm and normal heart sounds.  Pulmonary/Chest: Effort normal and breath sounds normal.  Musculoskeletal: Normal range of motion.  Neurological: She is alert and oriented to person, place, and time. She has normal strength. No cranial nerve deficit or sensory deficit.  Psychiatric: She has a normal mood and affect. Her behavior is normal.  Nursing note and vitals reviewed.  ED  Treatments / Results  Labs (all labs ordered are listed, but only abnormal results are displayed) Labs Reviewed - No data to display  EKG None  Radiology No results found.  Procedures Procedures (including critical care time)  Medications Ordered in ED Medications  sodium chloride 0.9 % bolus 1,000 mL (1,000 mLs Intravenous New Bag/Given 11/25/17 1607)  diphenhydrAMINE (BENADRYL) injection 12.5 mg (12.5 mg Intravenous Given 11/25/17 1613)  metoCLOPramide (REGLAN) injection 10 mg (10 mg Intravenous Given 11/25/17 1610)  acetaminophen (TYLENOL) tablet 325 mg (325 mg Oral Given 11/25/17 1608)     Initial Impression / Assessment and Plan / ED Course  Triage vital signs and the nursing notes have been reviewed.  Pertinent labs & imaging results that were available during care of the patient were reviewed and considered in medical decision making (see chart for details).  Patient presents with concerns of headache. She states that this headache is less severe from her typical migraines and that she has not tried any treatment prior to coming to the ED.Marland Kitchen. She denies any head trauma or new neuro complaints that warrant head imaging. Patient's physical exam is unremarkable and there are no neuro deficits. Will administer modification of migraine cocktail for acute relief. Patient refused Toradol as she states that she cannot tolerate prescription strength ibuprofen, but can take 200-400mg  without issue.  Patient also has concerns with her BP. She states it was 170/101 at work, but in the ED it was 153/90. Patient has no complaints that raise concern for end organ damage. She states that she was diagnosed with HTN at ED visit in 09/2017, was given 14 days of Norvasc and encouraged to follow-up with PCP. She states that she has not established care with PCP and currently does not have insurance.   Clinical Course as of Nov 25 1644  Mon Nov 25, 2017  1641 Patient re-evaluated after Tylenol and IV  Benadryl + Reglan + fluids were started. She reports significant improvement in symptoms.   [GM]    Clinical Course User Index [GM] Mortis, Sharyon MedicusGabrielle I, PA-C    Final Clinical Impressions(s) / ED Diagnoses  1. Headache. Migraine cocktail given for acute relief. Education provided on OTC and supportive treatment for future headaches. Advised to establish and follow-up with PCP. 2. HTN. Advised to follow-up with PCP and given resources for local clinics. Norvasc 5mg  QD x14 days prescribed. Education on BP readings plus s/s that would warrant return to the ED.  Dispo: Home. After thorough clinical evaluation, this patient is determined to be medically stable and can be safely discharged with the previously mentioned treatment and/or outpatient follow-up/referral(s). At this time, there are no other apparent medical conditions that require further screening, evaluation or treatment.   Final diagnoses:  Migraine without aura and without status migrainosus, not intractable  Hypertension, unspecified type    ED Discharge Orders         Ordered    amLODipine (NORVASC) 5 MG tablet  Daily     11/25/17 1644  Dagoberto Ligas I, [place] 11/25/17 1646    Pricilla Loveless, MD 11/26/17 1150

## 2018-02-24 ENCOUNTER — Emergency Department (HOSPITAL_COMMUNITY): Admission: EM | Admit: 2018-02-24 | Discharge: 2018-02-25 | Discharge status: Against Medical Advice | Payer: Self-pay

## 2018-04-28 ENCOUNTER — Encounter (HOSPITAL_COMMUNITY): Payer: Self-pay

## 2018-04-28 ENCOUNTER — Other Ambulatory Visit: Payer: Self-pay

## 2018-04-28 ENCOUNTER — Emergency Department (HOSPITAL_COMMUNITY)
Admission: EM | Admit: 2018-04-28 | Discharge: 2018-04-28 | Disposition: A | Discharge status: Home / Self Care | Payer: Self-pay | Attending: Emergency Medicine | Admitting: Emergency Medicine

## 2018-04-28 DIAGNOSIS — R52 Pain, unspecified: Secondary | ICD-10-CM | POA: Insufficient documentation

## 2018-04-28 DIAGNOSIS — Z5321 Procedure and treatment not carried out due to patient leaving prior to being seen by health care provider: Secondary | ICD-10-CM | POA: Insufficient documentation

## 2018-04-28 NOTE — ED Triage Notes (Signed)
Pt states that she was seen at urgent care and dx with PNA. Pt states that she has had a headache, back aches, neck aches, nausea (5 days, leg pain/cramping- x 1 week.  Pt states today, pain in back has decreased. Pt states that the back of her legs and knees have been aching. Pt states that since then, she has had dry skin on her face, which is painful.  Pt states she has been taking meloxicam and tramadol.  Pt states that she was not given abx at urgent care because she was just getting over the flu. Pt had taken xoffluzo and amoxicillin, as well as steroids for the flu.  Pt c/o generalized weakness.

## 2018-04-28 NOTE — ED Notes (Signed)
Pt states she does not want any blood work at this time, as she's had recent labs drawn at urgent care. Pt's paperwork from [facility] states she is FLU NEGATIVE.

## 2020-04-22 IMAGING — CR DG CHEST 2V
2 series · 2 of 2 positions shown · non-contrast
Comparison: 07/30/2016

CLINICAL DATA: Shortness of breath for several days

EXAM:
CHEST - 2 VIEW

[chest lat]
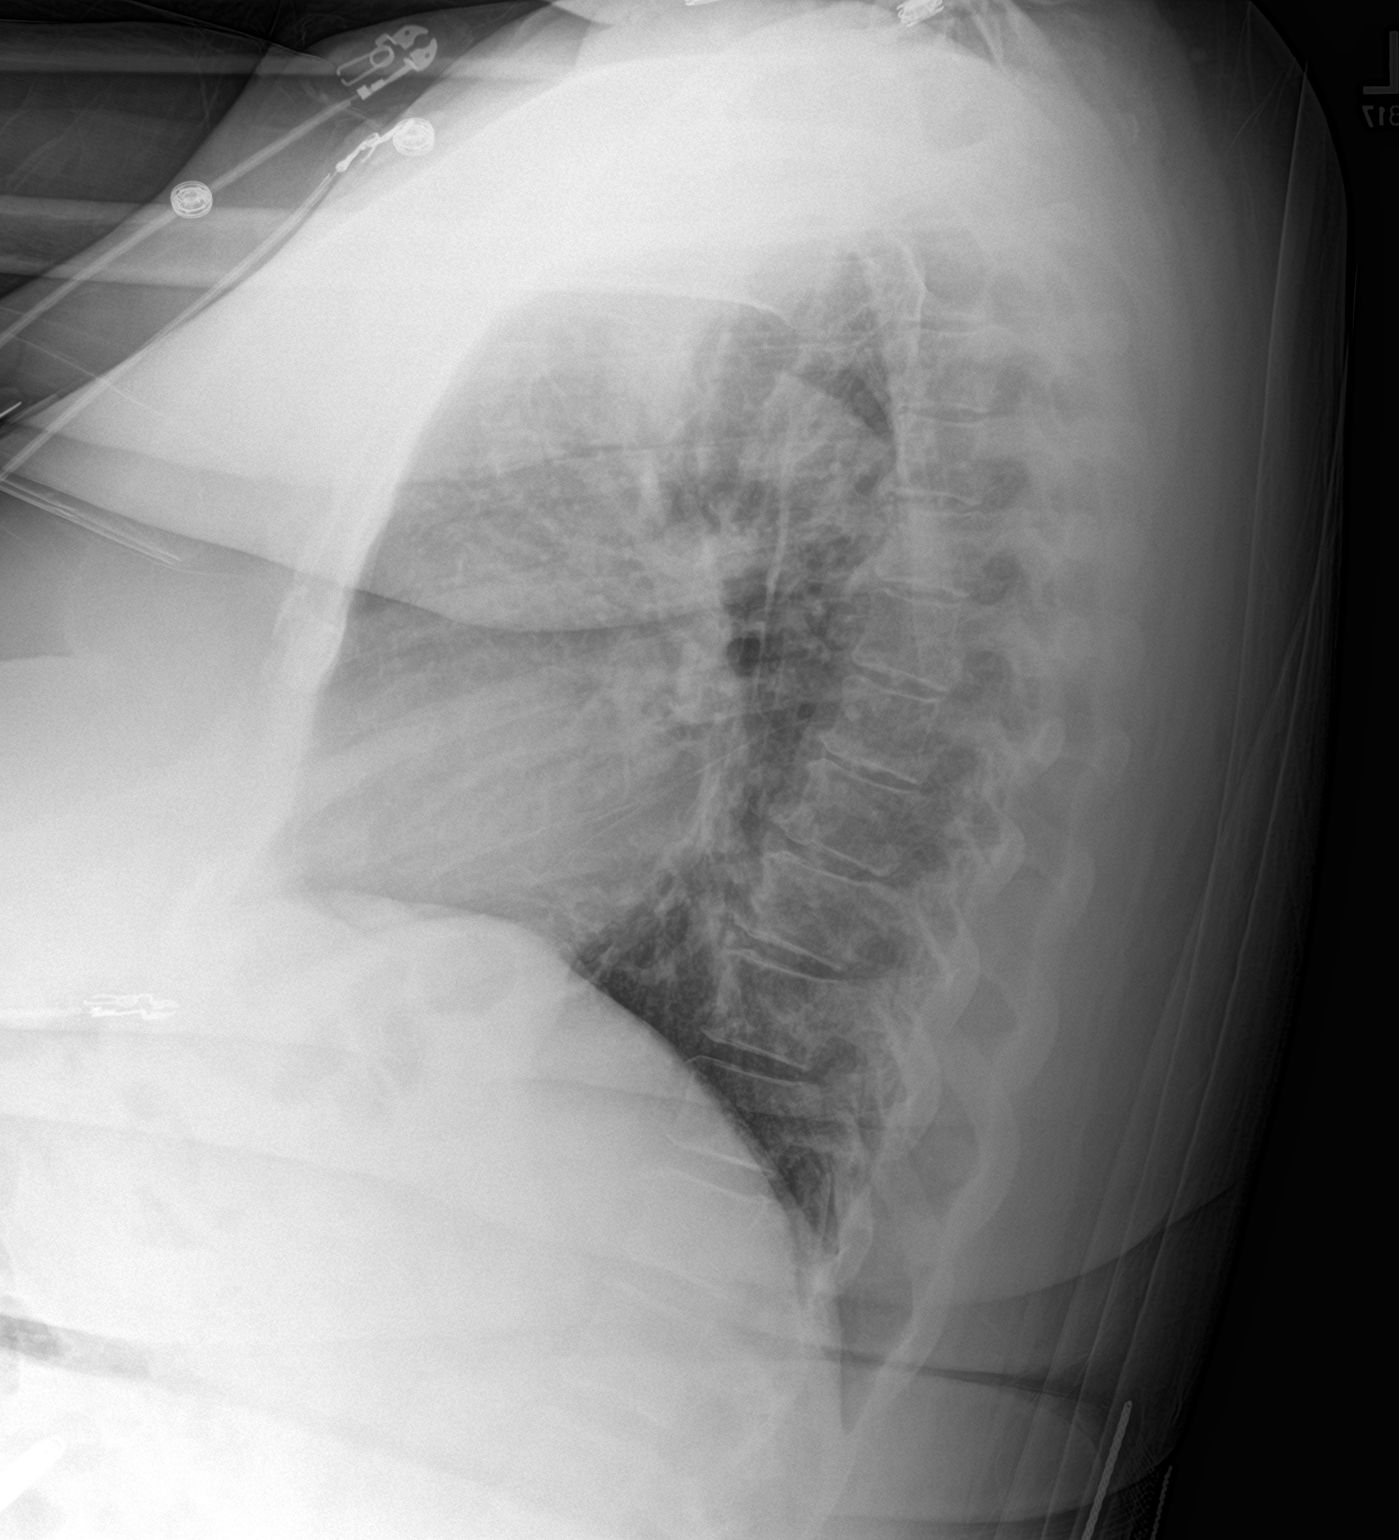

[chest ap]
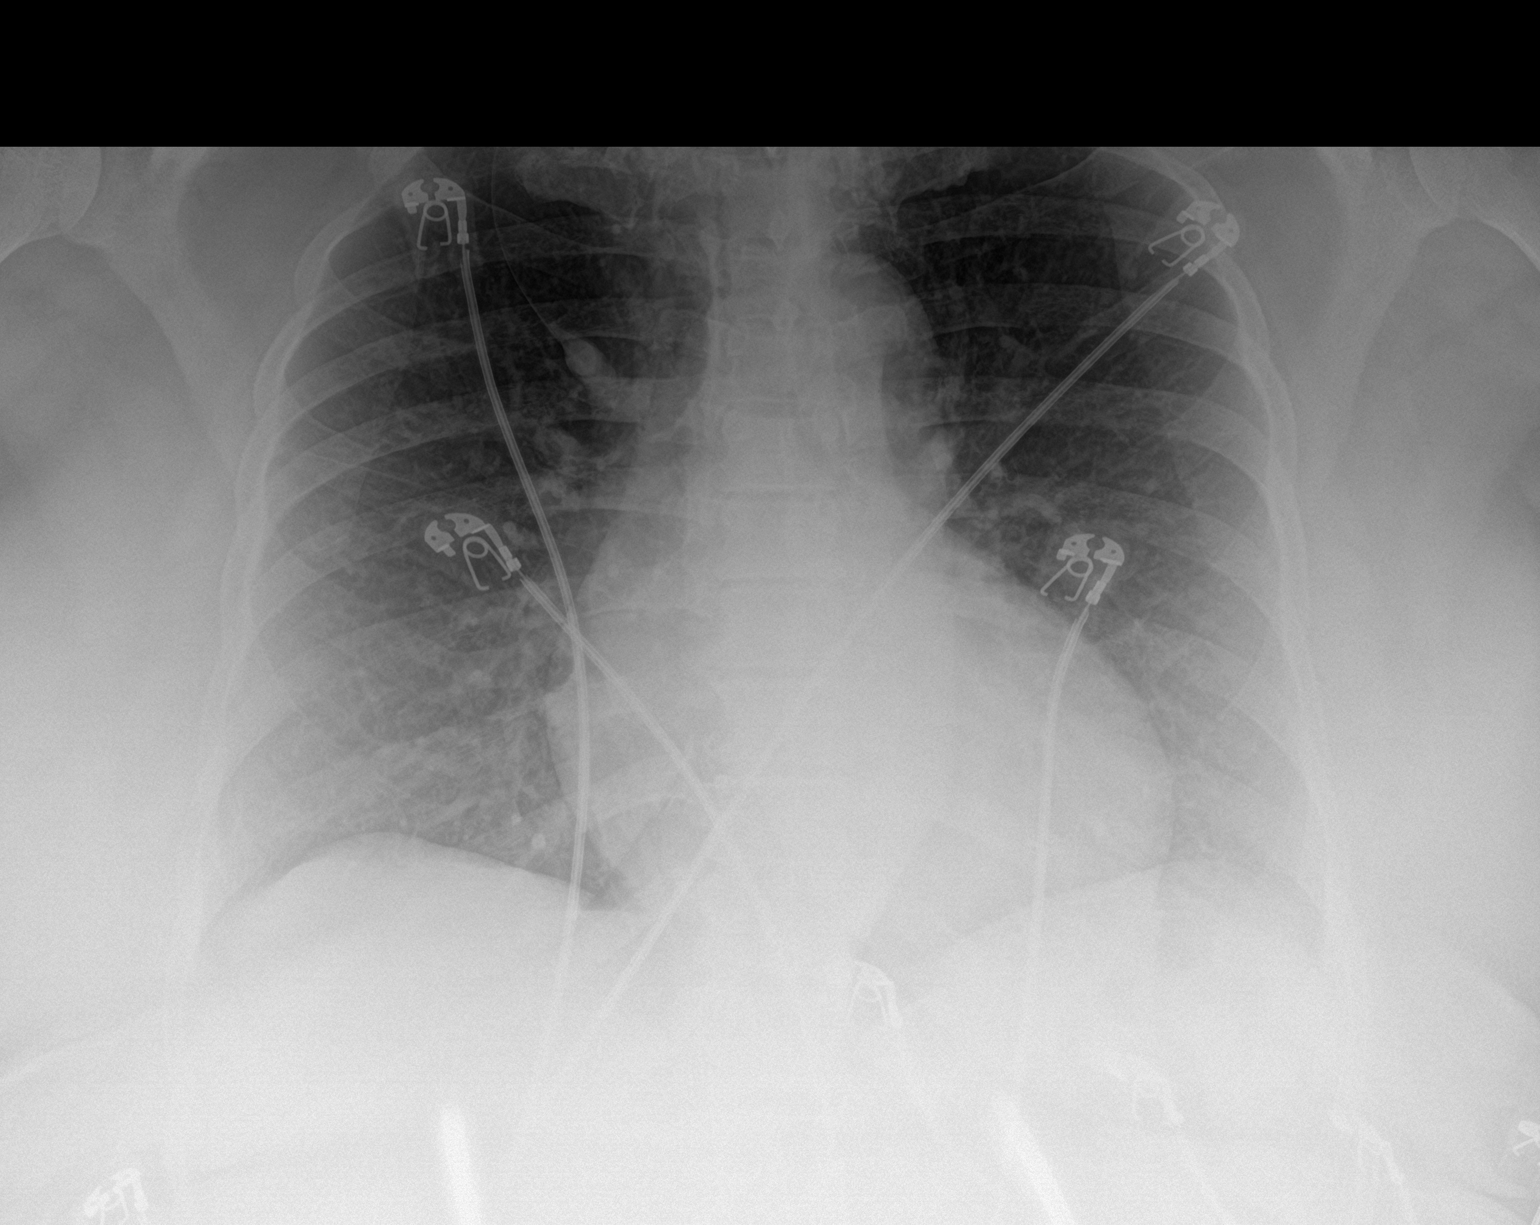

[2 of 2 positions shown; findings below may reference images not displayed]

FINDINGS: The heart size and mediastinal contours are within normal limits.
Both lungs are clear. The visualized skeletal structures are
unremarkable.
IMPRESSION: No active cardiopulmonary disease.
